# Patient Record
Sex: Female | Born: 1975 | Race: White | Hispanic: No | Marital: Married | Smoking: Former smoker
Health system: Southern US, Community
[De-identification: ages and names within clinical notes are randomized; demographics above are authoritative.]

## PROBLEM LIST (undated history)

## (undated) DIAGNOSIS — J309 Allergic rhinitis, unspecified: Secondary | ICD-10-CM

## (undated) DIAGNOSIS — K219 Gastro-esophageal reflux disease without esophagitis: Secondary | ICD-10-CM

## (undated) DIAGNOSIS — E785 Hyperlipidemia, unspecified: Secondary | ICD-10-CM

## (undated) DIAGNOSIS — F419 Anxiety disorder, unspecified: Secondary | ICD-10-CM

## (undated) DIAGNOSIS — K589 Irritable bowel syndrome without diarrhea: Secondary | ICD-10-CM

## (undated) DIAGNOSIS — Z856 Personal history of leukemia: Secondary | ICD-10-CM

## (undated) DIAGNOSIS — M519 Unspecified thoracic, thoracolumbar and lumbosacral intervertebral disc disorder: Secondary | ICD-10-CM

## (undated) DIAGNOSIS — I10 Essential (primary) hypertension: Secondary | ICD-10-CM

## (undated) HISTORY — PX: BILATERAL SALPINGECTOMY: SHX5743

## (undated) HISTORY — DX: Hyperlipidemia, unspecified: E78.5

## (undated) HISTORY — PX: CHOLECYSTECTOMY: SHX55

## (undated) HISTORY — DX: Personal history of leukemia: Z85.6

## (undated) HISTORY — DX: Unspecified thoracic, thoracolumbar and lumbosacral intervertebral disc disorder: M51.9

## (undated) HISTORY — PX: BACK SURGERY: SHX140

## (undated) HISTORY — DX: Anxiety disorder, unspecified: F41.9

## (undated) HISTORY — DX: Gastro-esophageal reflux disease without esophagitis: K21.9

## (undated) HISTORY — DX: Irritable bowel syndrome, unspecified: K58.9

## (undated) HISTORY — PX: DILATION AND CURETTAGE OF UTERUS: SHX78

## (undated) HISTORY — DX: Essential (primary) hypertension: I10

## (undated) HISTORY — DX: Allergic rhinitis, unspecified: J30.9

## (undated) HISTORY — PX: UMBILICAL HERNIA REPAIR: SHX196

---

## 2014-03-24 ENCOUNTER — Encounter: Payer: Self-pay | Admitting: Cardiology

## 2014-03-26 ENCOUNTER — Encounter: Payer: Self-pay | Admitting: Cardiology

## 2015-08-05 ENCOUNTER — Encounter: Payer: Self-pay | Admitting: Cardiology

## 2015-08-20 ENCOUNTER — Ambulatory Visit: Payer: Self-pay | Admitting: Cardiology

## 2015-09-16 ENCOUNTER — Encounter: Payer: Self-pay | Admitting: *Deleted

## 2015-09-16 ENCOUNTER — Other Ambulatory Visit: Payer: Self-pay | Admitting: *Deleted

## 2015-09-18 ENCOUNTER — Encounter: Payer: Self-pay | Admitting: *Deleted

## 2015-09-18 ENCOUNTER — Ambulatory Visit (INDEPENDENT_AMBULATORY_CARE_PROVIDER_SITE_OTHER): Payer: BLUE CROSS/BLUE SHIELD | Admitting: Cardiology

## 2015-09-18 ENCOUNTER — Encounter: Payer: Self-pay | Admitting: Cardiology

## 2015-09-18 VITALS — BP 131/84 | HR 71 | Ht 68.0 in | Wt 244.0 lb

## 2015-09-18 DIAGNOSIS — Z136 Encounter for screening for cardiovascular disorders: Secondary | ICD-10-CM | POA: Diagnosis not present

## 2015-09-18 DIAGNOSIS — E785 Hyperlipidemia, unspecified: Secondary | ICD-10-CM

## 2015-09-18 DIAGNOSIS — I10 Essential (primary) hypertension: Secondary | ICD-10-CM | POA: Diagnosis not present

## 2015-09-18 DIAGNOSIS — F17201 Nicotine dependence, unspecified, in remission: Secondary | ICD-10-CM

## 2015-09-18 DIAGNOSIS — R0789 Other chest pain: Secondary | ICD-10-CM

## 2015-09-18 NOTE — Patient Instructions (Signed)
Your physician recommends that you schedule a follow-up appointment TO BE DETERMINED AFTER TESTING  Your physician recommends that you continue on your current medications as directed. Please refer to the Current Medication list given to you today.  Your physician has requested that you have a stress echocardiogram. For further information please visit [email or web address]. Please follow instruction sheet as given.  Thank you for choosing [facility]!!    

## 2015-09-18 NOTE — Progress Notes (Signed)
Cardiology Office Note  Date: 09/18/2015   ID: Michelle Jefferson, DOB 05-18-1976, MRN [id number]  Referring provider: Dr. Ruthy Dick  Consulting Cardiologist: [name], MD   Chief Complaint  Patient presents with  . Chest Pain    History of Present Illness: Michelle Jefferson is a 40 y.o. female referred for cardiology consultation by Dr. [name] I reviewed the provided records and updated her chart. She presents for the evaluation of chest discomfort that she has noticed over the last 6 months. She reports a sharp, pin-like sensation in her chest that is sporadic, usually lasts no more than a minute, and is not clearly associated with exertion.  She has a history of hypertension that developed after pregnancy (she has been pregnant a total of 4 times and lost 2). She has history of preeclampsia with her most recent pregnancy in 2015. Echocardiography at that time showed normal LVEF, no cardiomyopathy. She states that she is not quite back to herself following this event.  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 Describes job stress, but seems tolerated fairly well.  I reviewed her ECG today which shows sinus rhythm with nonspecific T-wave changes.  We reviewed her current medications. She is on Lipitor, follow-up lipids are noted below. She also takes labetalol for her blood pressure. She has no history of diabetes mellitus. No definite family history of premature cardiovascular disease.  Past Medical History  Diagnosis Date  . History of leukemia     Childhood  . Hyperlipemia   . Essential hypertension   . Allergic rhinitis   . GERD (gastroesophageal reflux disease)   . Anxiety   . IBS (irritable bowel syndrome)   . Lumbar disc disease     Past Surgical History  Procedure Laterality Date  . Cholecystectomy    . Umbilical hernia repair    . Back surgery    . Dilation and curettage of uterus    . Cesarean section    . Bilateral salpingectomy       Current Outpatient Prescriptions  Medication Sig Dispense Refill  . atorvastatin (LIPITOR) 20 MG tablet Take 20 mg by mouth daily.  6  . FLUoxetine (PROZAC) 40 MG capsule Take 1 capsule by mouth daily.    . fluticasone (FLONASE) 50 MCG/ACT nasal spray Place into the nose as needed.    . labetalol (NORMODYNE) 100 MG tablet Take 1 tablet by mouth 2 (two) times daily.  6  . levofloxacin (LEVAQUIN) 500 MG tablet Take 500 mg by mouth daily.     Marland Kitchen levonorgestrel (MIRENA, 52 MG,) 20 MCG/24HR IUD 1 each by Intrauterine route once.    Marland Kitchen omeprazole (PRILOSEC OTC) 20 MG tablet Take 20 mg by mouth daily.      No current facility-administered medications for this visit.   Allergies:  Other; Sulfa antibiotics; and Cefaclor   Social History: The patient  reports that she quit smoking about 9 years ago. She does not have any smokeless tobacco history on file. She reports that she does not drink alcohol or use illicit drugs.   Family History: The patient's family history includes Diabetes in her father, maternal grandmother, and paternal aunt; Emphysema in her mother; Heart disease in her maternal grandfather and paternal grandfather; Hypertension in her father, mother, paternal grandfather, and paternal grandmother; Lung cancer in her father; Skin cancer in her mother; Thyroid disease in her maternal grandmother.   ROS:  Please see the history of present illness. Otherwise, complete review of  systems is positive for history of lower back pain.  All other systems are reviewed and negative.   Physical Exam: VS:  BP 131/84 mmHg  Pulse 71  Ht 5\' 8"  (1.727 m)  Wt 244 lb (110.678 kg)  BMI 37.11 kg/m2  SpO2 97%, BMI Body mass index is 37.11 kg/(m^2).  Wt Readings from Last 3 Encounters:  09/18/15 244 lb (110.678 kg)    General: Overweight woman, appears comfortable at rest. HEENT: Conjunctiva and lids normal, oropharynx clear. Neck: Supple, no elevated JVP or carotid bruits, no thyromegaly. Lungs:  Clear to auscultation, nonlabored breathing at rest. Cardiac: Regular rate and rhythm, no S3 or significant systolic murmur, no pericardial rub. Abdomen: Soft, nontender, bowel sounds present, no guarding or rebound. Extremities: No pitting edema, distal pulses 2+. Skin: Warm and dry. Musculoskeletal: No kyphosis. Neuropsychiatric: Alert and oriented x3, affect grossly appropriate.  ECG: I personally reviewed the prior tracing from 03/24/2014 which showed normal sinus rhythm.  Recent Labwork:  March 2017: Cholesterol 171, triglycerides 234, HDL 48, LDL 76  Other Studies Reviewed Today:  Echocardiogram 03/26/2014 [facility]([place]): Normal LV wall thickness with LVEF 60-65%, normal diastolic function, trace mitral regurgitation, trace tricuspid regurgitation, no pericardial effusion.  Assessment and Plan:  1. Atypical chest pain in an overweight 40 year old woman with history of hyperlipidemia and hypertension. ECG is nonspecific overall. She has a history of preeclampsia with her last pregnancy in 2015 but no cardiomyopathy, echocardiogram reviewed. We have discussed general risk factor modification and also basic ischemic evaluation. An exercise echocardiogram (with Definity contrast if needed) is being ordered.  2. Essential hypertension, blood pressure is adequately controlled today.  3. Hyperlipidemia, on statin therapy, recent LDL 76.  4. Tobacco abuse in remission, quit several years ago.  Current medicines were reviewed with the patient today.   Orders Placed This Encounter  Procedures  . EKG 12-Lead  . Echo stress    Disposition: Call with results.   Signed, Jonelle SidleSamuel G. Maddox Hlavaty, MD, [facility] 09/18/2015 2:00 PM    [facility] [address], [place], [place] [address] Phone: [phone](336) [phone]; Fax: [phone](336) [phone]

## 2015-09-18 NOTE — Addendum Note (Signed)
Addended by: Burman NievesASHWORTH, Aariona Momon T on: 09/18/2015 02:17 PM   Modules accepted: Orders

## 2015-09-28 ENCOUNTER — Other Ambulatory Visit (HOSPITAL_COMMUNITY): Payer: BLUE CROSS/BLUE SHIELD

## 2015-09-29 ENCOUNTER — Ambulatory Visit (HOSPITAL_COMMUNITY): Admission: RE | Admit: 2015-09-29 | Payer: BLUE CROSS/BLUE SHIELD | Source: Ambulatory Visit

## 2015-10-12 ENCOUNTER — Telehealth: Payer: Self-pay | Admitting: *Deleted

## 2015-10-12 ENCOUNTER — Encounter: Payer: Self-pay | Admitting: *Deleted

## 2015-10-12 ENCOUNTER — Telehealth: Payer: Self-pay | Admitting: Cardiology

## 2015-10-12 DIAGNOSIS — R072 Precordial pain: Secondary | ICD-10-CM

## 2015-10-12 NOTE — Telephone Encounter (Signed)
If she cannot ambulate on the treadmill, we could proceed with a Lexiscan Cardiolite instead.

## 2015-10-12 NOTE — Telephone Encounter (Signed)
Per husband, due to patient having a h/o of back surgery, patient doesn't feel like she can walk on a treadmill with incline. Patient is requesting to have stress test changed to a nuclear study. Please advise.

## 2015-10-12 NOTE — Telephone Encounter (Signed)
Husband informed and instructions for lexiscan read to husband over the phone. Husband would like to be informed directly with appointment information.

## 2015-10-12 NOTE — Telephone Encounter (Signed)
[facility] arrive at 8:45 Precordial pain Sched Instruct: Lexiscan Cardiolite on medication   Spoke with Mindi JunkerMarsha and gave appt time

## 2015-10-12 NOTE — Telephone Encounter (Signed)
No precert rqd

## 2015-10-16 ENCOUNTER — Encounter (HOSPITAL_COMMUNITY)
Admission: RE | Admit: 2015-10-16 | Discharge: 2015-10-16 | Disposition: A | Discharge status: Home / Self Care | Payer: BLUE CROSS/BLUE SHIELD | Source: Ambulatory Visit | Attending: Cardiology | Admitting: Cardiology

## 2015-10-16 ENCOUNTER — Inpatient Hospital Stay (HOSPITAL_COMMUNITY): Admission: RE | Admit: 2015-10-16 | Payer: BLUE CROSS/BLUE SHIELD | Source: Ambulatory Visit

## 2015-10-16 ENCOUNTER — Other Ambulatory Visit (HOSPITAL_COMMUNITY): Payer: BLUE CROSS/BLUE SHIELD

## 2015-10-16 DIAGNOSIS — I510 Cardiac septal defect, acquired: Secondary | ICD-10-CM | POA: Insufficient documentation

## 2015-10-16 DIAGNOSIS — R072 Precordial pain: Secondary | ICD-10-CM | POA: Insufficient documentation

## 2015-10-16 DIAGNOSIS — I5189 Other ill-defined heart diseases: Secondary | ICD-10-CM | POA: Diagnosis not present

## 2015-10-16 MED ORDER — TECHNETIUM TC 99M SESTAMIBI GENERIC - CARDIOLITE
10.0000 | Freq: Once | INTRAVENOUS | Status: AC | PRN
  Administered 2015-10-16: 10 via INTRAVENOUS

## 2015-10-16 MED ORDER — SODIUM CHLORIDE 0.9% FLUSH
INTRAVENOUS | Status: AC
  Administered 2015-10-16: 10 mL via INTRAVENOUS
  Filled 2015-10-16: qty 10

## 2015-10-16 MED ORDER — TECHNETIUM TC 99M SESTAMIBI - CARDIOLITE
30.0000 | Freq: Once | INTRAVENOUS | Status: AC | PRN
  Administered 2015-10-16: 30 via INTRAVENOUS

## 2015-10-16 MED ORDER — REGADENOSON 0.4 MG/5ML IV SOLN
INTRAVENOUS | Status: AC
  Administered 2015-10-16: 0.4 mg via INTRAVENOUS
  Filled 2015-10-16: qty 5

## 2015-10-19 LAB — NM MYOCAR MULTI W/SPECT W/WALL MOTION / EF
CHL CUP RESTING HR STRESS: 70 {beats}/min
CSEPPHR: 108 {beats}/min
LV dias vol: 114 mL (ref 46–106)
LVSYSVOL: 65 mL
RATE: 0.23
SDS: 8
SRS: 0
SSS: 8
TID: 1

## 2015-10-20 ENCOUNTER — Encounter: Payer: Self-pay | Admitting: Cardiology

## 2015-10-20 ENCOUNTER — Telehealth: Payer: Self-pay | Admitting: *Deleted

## 2015-10-20 ENCOUNTER — Encounter: Payer: Self-pay | Admitting: *Deleted

## 2015-10-20 ENCOUNTER — Ambulatory Visit (INDEPENDENT_AMBULATORY_CARE_PROVIDER_SITE_OTHER): Payer: BLUE CROSS/BLUE SHIELD | Admitting: Cardiology

## 2015-10-20 ENCOUNTER — Other Ambulatory Visit (HOSPITAL_COMMUNITY): Admission: RE | Admit: 2015-10-20 | Payer: BLUE CROSS/BLUE SHIELD | Source: Ambulatory Visit | Admitting: Cardiology

## 2015-10-20 ENCOUNTER — Other Ambulatory Visit (HOSPITAL_COMMUNITY)
Admission: RE | Admit: 2015-10-20 | Discharge: 2015-10-20 | Disposition: A | Discharge status: Home / Self Care | Payer: BLUE CROSS/BLUE SHIELD | Source: Ambulatory Visit | Attending: Cardiology | Admitting: Cardiology

## 2015-10-20 ENCOUNTER — Ambulatory Visit (HOSPITAL_COMMUNITY)
Admission: RE | Admit: 2015-10-20 | Discharge: 2015-10-20 | Disposition: A | Discharge status: Home / Self Care | Payer: BLUE CROSS/BLUE SHIELD | Source: Ambulatory Visit | Attending: Cardiology | Admitting: Cardiology

## 2015-10-20 VITALS — BP 134/88 | HR 95 | Ht 68.0 in | Wt 246.0 lb

## 2015-10-20 DIAGNOSIS — I2089 Other forms of angina pectoris: Secondary | ICD-10-CM

## 2015-10-20 DIAGNOSIS — R931 Abnormal findings on diagnostic imaging of heart and coronary circulation: Secondary | ICD-10-CM | POA: Insufficient documentation

## 2015-10-20 DIAGNOSIS — I208 Other forms of angina pectoris: Secondary | ICD-10-CM | POA: Diagnosis not present

## 2015-10-20 DIAGNOSIS — E785 Hyperlipidemia, unspecified: Secondary | ICD-10-CM | POA: Insufficient documentation

## 2015-10-20 DIAGNOSIS — R9439 Abnormal result of other cardiovascular function study: Secondary | ICD-10-CM

## 2015-10-20 DIAGNOSIS — Z01810 Encounter for preprocedural cardiovascular examination: Secondary | ICD-10-CM | POA: Diagnosis not present

## 2015-10-20 DIAGNOSIS — Z01812 Encounter for preprocedural laboratory examination: Secondary | ICD-10-CM | POA: Diagnosis present

## 2015-10-20 DIAGNOSIS — I10 Essential (primary) hypertension: Secondary | ICD-10-CM | POA: Diagnosis not present

## 2015-10-20 DIAGNOSIS — I209 Angina pectoris, unspecified: Secondary | ICD-10-CM | POA: Insufficient documentation

## 2015-10-20 LAB — CBC WITH DIFFERENTIAL/PLATELET
BASOS ABS: 0 10*3/uL (ref 0.0–0.1)
Basophils Relative: 0 %
EOS ABS: 0.2 10*3/uL (ref 0.0–0.7)
EOS PCT: 2 %
HCT: 38.4 % (ref 36.0–46.0)
HEMOGLOBIN: 12.4 g/dL (ref 12.0–15.0)
LYMPHS PCT: 32 %
Lymphs Abs: 3.2 10*3/uL (ref 0.7–4.0)
MCH: 28.5 pg (ref 26.0–34.0)
MCHC: 32.3 g/dL (ref 30.0–36.0)
MCV: 88.3 fL (ref 78.0–100.0)
Monocytes Absolute: 0.7 10*3/uL (ref 0.1–1.0)
Monocytes Relative: 7 %
NEUTROS PCT: 59 %
Neutro Abs: 5.8 10*3/uL (ref 1.7–7.7)
PLATELETS: 380 10*3/uL (ref 150–400)
RBC: 4.35 MIL/uL (ref 3.87–5.11)
RDW: 14.1 % (ref 11.5–15.5)
WBC: 9.9 10*3/uL (ref 4.0–10.5)

## 2015-10-20 LAB — BASIC METABOLIC PANEL
Anion gap: 8 (ref 5–15)
BUN: 11 mg/dL (ref 6–20)
CO2: 26 mmol/L (ref 22–32)
Calcium: 9.3 mg/dL (ref 8.9–10.3)
Chloride: 104 mmol/L (ref 101–111)
Creatinine, Ser: 0.78 mg/dL (ref 0.44–1.00)
Glucose, Bld: 102 mg/dL — ABNORMAL HIGH (ref 65–99)
POTASSIUM: 3.5 mmol/L (ref 3.5–5.1)
SODIUM: 138 mmol/L (ref 135–145)

## 2015-10-20 LAB — PROTIME-INR
INR: 0.98 (ref 0.00–1.49)
PROTHROMBIN TIME: 13.2 s (ref 11.6–15.2)

## 2015-10-20 MED ORDER — METOPROLOL TARTRATE 25 MG PO TABS: 12.5000 mg | ORAL_TABLET | Freq: Two times a day (BID) | ORAL | Status: AC

## 2015-10-20 MED ORDER — ASPIRIN EC 81 MG PO TBEC: 81.0000 mg | DELAYED_RELEASE_TABLET | Freq: Every day | ORAL | Status: AC

## 2015-10-20 NOTE — Telephone Encounter (Signed)
Patient informed and verbalized understanding of plan. 

## 2015-10-20 NOTE — Telephone Encounter (Signed)
-----   Message from Jonelle SidleSamuel G McDowell, MD sent at 10/19/2015  3:10 PM EDT ----- Reviewed report. Somewhat surprised by test abnormalities based on her description of symptoms. Test is abnormal suggesting possibility of LAD distribution ischemia with reduced LVEF. She should be scheduled for a follow-up visit soon to discuss cardiac catheterization for more definitive diagnosis. She can follow-up with me or an APP in the [place] office depending on availability.

## 2015-10-20 NOTE — Progress Notes (Signed)
Cardiology Office Note   Date:  10/20/2015   ID:  Michelle Jefferson, DOB 07-25-1975, MRN [id number]  PCP:  Majel Homer, MD  Cardiologist: Dr. Diona Browner      Chief Complaint  Patient presents with  . Chest Pain    occ episode, here for results      History of Present Illness: Michelle Jefferson is a 40 y.o. female who presents for results of stress test and discuss cardiac cath.    She has a history of hypertension that developed after pregnancy (she has been pregnant a total of 4 times and lost 2). She has history of preeclampsia with her most recent pregnancy in 2015. Echocardiography at that time showed normal LVEF, no cardiomyopathy. She stated that she is not quite back to herself following this event.  She was seen by Dr. Diona Browner for chest discomfort that have been occurring over last 6 months.    She had nuc study 10/16/15 with findings of prior ant. MI with significant peri-infarct ischemia, the ant. Wall defect is nearly completely reversible EF 30-44%.  She had normal LV function on Echo 2015.   She has brief episodes of sharp pain.  She is anxious today.  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   Discussed cath and possible results, stent, medical therapy or false positive.    Past Medical History  Diagnosis Date  . History of leukemia     Childhood  . Hyperlipemia   . Essential hypertension   . Allergic rhinitis   . GERD (gastroesophageal reflux disease)   . Anxiety   . IBS (irritable bowel syndrome)   . Lumbar disc disease     Past Surgical History  Procedure Laterality Date  . Cholecystectomy    . Umbilical hernia repair    . Back surgery    . Dilation and curettage of uterus    . Cesarean section    . Bilateral salpingectomy       Current Outpatient Prescriptions  Medication Sig Dispense Refill  . atorvastatin (LIPITOR) 20 MG tablet Take 20 mg by mouth daily.  6  . FLUoxetine (PROZAC) 40 MG capsule Take 1 capsule by mouth daily.      . fluticasone (FLONASE) 50 MCG/ACT nasal spray Place into the nose as needed.    . labetalol (NORMODYNE) 100 MG tablet Take 1 tablet by mouth 2 (two) times daily.  6  . levonorgestrel (MIRENA, 52 MG,) 20 MCG/24HR IUD 1 each by Intrauterine route once.    Marland Kitchen omeprazole (PRILOSEC OTC) 20 MG tablet Take 20 mg by mouth daily.     Marland Kitchen aspirin EC 81 MG tablet Take 1 tablet (81 mg total) by mouth daily. 90 tablet 3  . metoprolol tartrate (LOPRESSOR) 25 MG tablet Take 0.5 tablets (12.5 mg total) by mouth 2 (two) times daily. 90 tablet 3   No current facility-administered medications for this visit.    Allergies:   Other; Sulfa antibiotics; and Cefaclor    Social History:  The patient  reports that she quit smoking about 9 years ago. She does not have any smokeless tobacco history on file. She reports that she does not drink alcohol or use illicit drugs.   Family History:  The patient's family history includes Diabetes in her father, maternal grandmother, and paternal aunt; Emphysema in her mother; Heart disease in her maternal grandfather and paternal grandfather; Hypertension in her father, mother, paternal grandfather, and paternal grandmother; Lung cancer in her father; Skin  cancer in her mother; Thyroid disease in her maternal grandmother.    ROS:  General:no colds or fevers, no weight changes Skin:no rashes or ulcers HEENT:no blurred vision, no congestion CV:see HPI PUL:see HPI GI:no diarrhea constipation or melena, no indigestion GU:no hematuria, no dysuria MS:no joint pain, no claudication Neuro:no syncope, no lightheadedness Endo:no diabetes, no thyroid disease GYN on her period now.  Wt Readings from Last 3 Encounters:  10/20/15 246 lb (111.585 kg)  09/18/15 244 lb (110.678 kg)     PHYSICAL EXAM: VS:  BP 134/88 mmHg  Pulse 95  Ht 5\' 8"  (1.727 m)  Wt 246 lb (111.585 kg)  BMI 37.41 kg/m2  SpO2 98%  LMP 10/16/2015 , BMI Body mass index is 37.41 kg/(m^2). General:Pleasant  affect, NAD Skin:Warm and dry, brisk capillary refill HEENT:normocephalic, sclera clear, mucus membranes moist Neck:supple, no JVD, no bruits  Heart:S1S2 RRR without murmur, gallup, rub or click Lungs:clear without rales, rhonchi, or wheezes ZOX:WRUEAbd:soft, non tender, + BS, do not palpate liver spleen or masses Ext:no lower ext edema, 2+ pedal pulses, 2+ radial pulses Neuro:alert and oriented X 3, MAE, follows commands, + facial symmetry    EKG:  EKG is NOT ordered today.    Recent Labs: No results found for requested labs within last 365 days.    Lipid Panel No results found for: CHOL, TRIG, HDL, CHOLHDL, VLDL, LDLCALC, LDLDIRECT     Other studies Reviewed: Additional studies/ records that were reviewed today include: NUC stress test 10/16/15:   No T wave inversion was noted during stress.  There was no ST segment deviation noted during stress.  Findings consistent with prior anterior myocardial infarction with significant peri-infarct ischemia. The anterior wall defect is nearly completely reversible.  This is a high risk study based on large reversible anterior wall defect and decreased LVEF.  The left ventricular ejection fraction is moderately decreased (30-44%).   ASSESSMENT AND PLAN:  1.  Angina occ episode   2. abnormal lexiscan myoview. With ischemia in LAD distribution.  Discussed cardiac cath.    The patient understands that risks included but are not limited to stroke (1 in 1000), death (1 in 1000), kidney failure [usually temporary] (1 in 500), bleeding (1 in 200), allergic reaction [possibly serious] (1 in 200).    I started her on asa 81 mg daily and lopressor 12.5 BID.  Plan for cath on Thursday, she would like to have done- her daughter has play next week.   She is on statin.  3. Essential HTN  4. Hyperlipidemia, continue statin.   Current medicines are reviewed with the patient today.  The patient Has no concerns regarding medicines.  The  following changes have been made:  See above Labs/ tests ordered today include:see above  Disposition:   FU:  see above  Signed, Nada BoozerLaura Ingold, NP  10/20/2015 4:07 PM    [facility] [address], [place], [place]  [address]/ [address] [place], [place] Phone: [phone](336) [phone]; Fax: [phone]  [phone]

## 2015-10-20 NOTE — Patient Instructions (Signed)
Your physician has requested that you have a cardiac catheterization. Cardiac catheterization is used to diagnose and/or treat various heart conditions. Doctors may recommend this procedure for a number of different reasons. The most common reason is to evaluate chest pain. Chest pain can be a symptom of coronary artery disease (CAD), and cardiac catheterization can show whether plaque is narrowing or blocking your heart's arteries. This procedure is also used to evaluate the valves, as well as measure the blood flow and oxygen levels in different parts of your heart. For further information please visit [email or web address] Please follow instruction sheet, as given.   Your physician has recommended you make the following change in your medication:   Start Lopressor 12.5 mg Two Times Daily  Start Aspirin 81 mg Daily  Your physician recommends that you have lab work done today.   If you need a refill on your cardiac medications before your next appointment, please call your pharmacy.  Thank you for choosing [facility]!

## 2015-10-22 ENCOUNTER — Encounter (HOSPITAL_COMMUNITY): Payer: Self-pay | Admitting: Interventional Cardiology

## 2015-10-22 ENCOUNTER — Encounter (HOSPITAL_COMMUNITY)
Admission: RE | Disposition: A | Discharge status: Home / Self Care | Payer: Self-pay | Source: Ambulatory Visit | Attending: Interventional Cardiology

## 2015-10-22 ENCOUNTER — Ambulatory Visit (HOSPITAL_COMMUNITY)
Admission: RE | Admit: 2015-10-22 | Discharge: 2015-10-22 | Disposition: A | Discharge status: Home / Self Care | Payer: BLUE CROSS/BLUE SHIELD | Source: Ambulatory Visit | Attending: Interventional Cardiology | Admitting: Interventional Cardiology

## 2015-10-22 DIAGNOSIS — Z8249 Family history of ischemic heart disease and other diseases of the circulatory system: Secondary | ICD-10-CM | POA: Insufficient documentation

## 2015-10-22 DIAGNOSIS — M4646 Discitis, unspecified, lumbar region: Secondary | ICD-10-CM | POA: Insufficient documentation

## 2015-10-22 DIAGNOSIS — I10 Essential (primary) hypertension: Secondary | ICD-10-CM | POA: Insufficient documentation

## 2015-10-22 DIAGNOSIS — Z833 Family history of diabetes mellitus: Secondary | ICD-10-CM | POA: Insufficient documentation

## 2015-10-22 DIAGNOSIS — R931 Abnormal findings on diagnostic imaging of heart and coronary circulation: Secondary | ICD-10-CM | POA: Diagnosis not present

## 2015-10-22 DIAGNOSIS — E785 Hyperlipidemia, unspecified: Secondary | ICD-10-CM | POA: Insufficient documentation

## 2015-10-22 DIAGNOSIS — I209 Angina pectoris, unspecified: Secondary | ICD-10-CM | POA: Insufficient documentation

## 2015-10-22 DIAGNOSIS — F419 Anxiety disorder, unspecified: Secondary | ICD-10-CM | POA: Diagnosis not present

## 2015-10-22 DIAGNOSIS — K589 Irritable bowel syndrome without diarrhea: Secondary | ICD-10-CM | POA: Insufficient documentation

## 2015-10-22 DIAGNOSIS — Z87891 Personal history of nicotine dependence: Secondary | ICD-10-CM | POA: Diagnosis not present

## 2015-10-22 DIAGNOSIS — J309 Allergic rhinitis, unspecified: Secondary | ICD-10-CM | POA: Diagnosis not present

## 2015-10-22 DIAGNOSIS — R9439 Abnormal result of other cardiovascular function study: Secondary | ICD-10-CM | POA: Insufficient documentation

## 2015-10-22 DIAGNOSIS — K219 Gastro-esophageal reflux disease without esophagitis: Secondary | ICD-10-CM | POA: Insufficient documentation

## 2015-10-22 HISTORY — PX: CARDIAC CATHETERIZATION: SHX172

## 2015-10-22 LAB — PREGNANCY, URINE: Preg Test, Ur: NEGATIVE

## 2015-10-22 SURGERY — LEFT HEART CATH AND CORONARY ANGIOGRAPHY
Anesthesia: LOCAL

## 2015-10-22 MED ORDER — SODIUM CHLORIDE 0.9 % IV SOLN: 250.0000 mL | INTRAVENOUS | Status: DC | PRN

## 2015-10-22 MED ORDER — MIDAZOLAM HCL 2 MG/2ML IJ SOLN
INTRAMUSCULAR | Status: AC
  Filled 2015-10-22: qty 2

## 2015-10-22 MED ORDER — SODIUM CHLORIDE 0.9% FLUSH: 3.0000 mL | INTRAVENOUS | Status: DC | PRN

## 2015-10-22 MED ORDER — FENTANYL CITRATE (PF) 100 MCG/2ML IJ SOLN
INTRAMUSCULAR | Status: AC
  Filled 2015-10-22: qty 2

## 2015-10-22 MED ORDER — SODIUM CHLORIDE 0.9 % WEIGHT BASED INFUSION
3.0000 mL/kg/h | INTRAVENOUS | Status: DC
  Administered 2015-10-22: 3 mL/kg/h via INTRAVENOUS

## 2015-10-22 MED ORDER — SODIUM CHLORIDE 0.9 % WEIGHT BASED INFUSION: 1.0000 mL/kg/h | INTRAVENOUS | Status: DC

## 2015-10-22 MED ORDER — LIDOCAINE HCL (PF) 1 % IJ SOLN
INTRAMUSCULAR | Status: DC | PRN
  Administered 2015-10-22: 2 mL via INTRADERMAL

## 2015-10-22 MED ORDER — IOPAMIDOL (ISOVUE-370) INJECTION 76%
INTRAVENOUS | Status: AC
  Filled 2015-10-22: qty 100

## 2015-10-22 MED ORDER — SODIUM CHLORIDE 0.9% FLUSH: 3.0000 mL | Freq: Two times a day (BID) | INTRAVENOUS | Status: DC

## 2015-10-22 MED ORDER — HEPARIN (PORCINE) IN NACL 2-0.9 UNIT/ML-% IJ SOLN
INTRAMUSCULAR | Status: DC | PRN
Start: 2015-10-22 — End: 2015-10-22
  Administered 2015-10-22: 1000 mL

## 2015-10-22 MED ORDER — MIDAZOLAM HCL 2 MG/2ML IJ SOLN
INTRAMUSCULAR | Status: DC | PRN
  Administered 2015-10-22: 1 mg via INTRAVENOUS
  Administered 2015-10-22: 2 mg via INTRAVENOUS

## 2015-10-22 MED ORDER — HEPARIN SODIUM (PORCINE) 1000 UNIT/ML IJ SOLN
INTRAMUSCULAR | Status: DC | PRN
  Administered 2015-10-22: 5000 [IU] via INTRAVENOUS

## 2015-10-22 MED ORDER — ASPIRIN 81 MG PO CHEW: 81.0000 mg | CHEWABLE_TABLET | ORAL | Status: DC

## 2015-10-22 MED ORDER — FENTANYL CITRATE (PF) 100 MCG/2ML IJ SOLN
INTRAMUSCULAR | Status: DC | PRN
  Administered 2015-10-22 (×2): 25 ug via INTRAVENOUS

## 2015-10-22 MED ORDER — LIDOCAINE HCL (PF) 1 % IJ SOLN
INTRAMUSCULAR | Status: AC
  Filled 2015-10-22: qty 30

## 2015-10-22 MED ORDER — VERAPAMIL HCL 2.5 MG/ML IV SOLN
INTRAVENOUS | Status: AC
  Filled 2015-10-22: qty 2

## 2015-10-22 MED ORDER — VERAPAMIL HCL 2.5 MG/ML IV SOLN
INTRAVENOUS | Status: DC | PRN
  Administered 2015-10-22: 10 mL via INTRA_ARTERIAL

## 2015-10-22 MED ORDER — HEPARIN SODIUM (PORCINE) 1000 UNIT/ML IJ SOLN
INTRAMUSCULAR | Status: AC
  Filled 2015-10-22: qty 1

## 2015-10-22 MED ORDER — HEPARIN (PORCINE) IN NACL 2-0.9 UNIT/ML-% IJ SOLN
INTRAMUSCULAR | Status: AC
  Filled 2015-10-22: qty 1000

## 2015-10-22 SURGICAL SUPPLY — 11 items

## 2015-10-22 NOTE — H&P (View-Only) (Signed)
 Cardiology Office Note   Date:  10/20/2015   ID:  [name], DOB 11/16/1975, MRN [id number]  PCP:  BUCHANAN,LINDA, MD  Cardiologist: Dr. McDowell      Chief Complaint  Patient presents with  . Chest Pain    occ episode, here for results      History of Present Illness: Michelle Jefferson is a 39 y.o. female who presents for results of stress test and discuss cardiac cath.    She has a history of hypertension that developed after pregnancy (she has been pregnant a total of 4 times and lost 2). She has history of preeclampsia with her most recent pregnancy in 2015. Echocardiography at that time showed normal LVEF, no cardiomyopathy. She stated that she is not quite back to herself following this event.  She was seen by Dr. McDowell for chest discomfort that have been occurring over last 6 months.    She had nuc study 10/16/15 with findings of prior ant. MI with significant peri-infarct ischemia, the ant. Wall defect is nearly completely reversible EF 30-44%.  She had normal LV function on Echo 2015.   She has brief episodes of sharp pain.  She is anxious today.  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   Discussed cath and possible results, stent, medical therapy or false positive.    Past Medical History  Diagnosis Date  . History of leukemia     Childhood  . Hyperlipemia   . Essential hypertension   . Allergic rhinitis   . GERD (gastroesophageal reflux disease)   . Anxiety   . IBS (irritable bowel syndrome)   . Lumbar disc disease     Past Surgical History  Procedure Laterality Date  . Cholecystectomy    . Umbilical hernia repair    . Back surgery    . Dilation and curettage of uterus    . Cesarean section    . Bilateral salpingectomy       Current Outpatient Prescriptions  Medication Sig Dispense Refill  . atorvastatin (LIPITOR) 20 MG tablet Take 20 mg by mouth daily.  6  . FLUoxetine (PROZAC) 40 MG capsule Take 1 capsule by mouth daily.      . fluticasone (FLONASE) 50 MCG/ACT nasal spray Place into the nose as needed.    . labetalol (NORMODYNE) 100 MG tablet Take 1 tablet by mouth 2 (two) times daily.  6  . levonorgestrel (MIRENA, 52 MG,) 20 MCG/24HR IUD 1 each by Intrauterine route once.    . omeprazole (PRILOSEC OTC) 20 MG tablet Take 20 mg by mouth daily.     . aspirin EC 81 MG tablet Take 1 tablet (81 mg total) by mouth daily. 90 tablet 3  . metoprolol tartrate (LOPRESSOR) 25 MG tablet Take 0.5 tablets (12.5 mg total) by mouth 2 (two) times daily. 90 tablet 3   No current facility-administered medications for this visit.    Allergies:   Other; Sulfa antibiotics; and Cefaclor    Social History:  The patient  reports that she quit smoking about 9 years ago. She does not have any smokeless tobacco history on file. She reports that she does not drink alcohol or use illicit drugs.   Family History:  The patient's family history includes Diabetes in her father, maternal grandmother, and paternal aunt; Emphysema in her mother; Heart disease in her maternal grandfather and paternal grandfather; Hypertension in her father, mother, paternal grandfather, and paternal grandmother; Lung cancer in her father; Skin   cancer in her mother; Thyroid disease in her maternal grandmother.    ROS:  General:no colds or fevers, no weight changes Skin:no rashes or ulcers HEENT:no blurred vision, no congestion CV:see HPI PUL:see HPI GI:no diarrhea constipation or melena, no indigestion GU:no hematuria, no dysuria MS:no joint pain, no claudication Neuro:no syncope, no lightheadedness Endo:no diabetes, no thyroid disease GYN on her period now.  Wt Readings from Last 3 Encounters:  10/20/15 246 lb (111.585 kg)  09/18/15 244 lb (110.678 kg)     PHYSICAL EXAM: VS:  BP 134/88 mmHg  Pulse 95  Ht 5\' 8"  (1.727 m)  Wt 246 lb (111.585 kg)  BMI 37.41 kg/m2  SpO2 98%  LMP 10/16/2015 , BMI Body mass index is 37.41 kg/(m^2). General:Pleasant  affect, NAD Skin:Warm and dry, brisk capillary refill HEENT:normocephalic, sclera clear, mucus membranes moist Neck:supple, no JVD, no bruits  Heart:S1S2 RRR without murmur, gallup, rub or click Lungs:clear without rales, rhonchi, or wheezes ZOX:WRUEAbd:soft, non tender, + BS, do not palpate liver spleen or masses Ext:no lower ext edema, 2+ pedal pulses, 2+ radial pulses Neuro:alert and oriented X 3, MAE, follows commands, + facial symmetry    EKG:  EKG is NOT ordered today.    Recent Labs: No results found for requested labs within last 365 days.    Lipid Panel No results found for: CHOL, TRIG, HDL, CHOLHDL, VLDL, LDLCALC, LDLDIRECT     Other studies Reviewed: Additional studies/ records that were reviewed today include: NUC stress test 10/16/15:   No T wave inversion was noted during stress.  There was no ST segment deviation noted during stress.  Findings consistent with prior anterior myocardial infarction with significant peri-infarct ischemia. The anterior wall defect is nearly completely reversible.  This is a high risk study based on large reversible anterior wall defect and decreased LVEF.  The left ventricular ejection fraction is moderately decreased (30-44%).   ASSESSMENT AND PLAN:  1.  Angina occ episode   2. abnormal lexiscan myoview. With ischemia in LAD distribution.  Discussed cardiac cath.    The patient understands that risks included but are not limited to stroke (1 in 1000), death (1 in 1000), kidney failure [usually temporary] (1 in 500), bleeding (1 in 200), allergic reaction [possibly serious] (1 in 200).    I started her on asa 81 mg daily and lopressor 12.5 BID.  Plan for cath on Thursday, she would like to have done- her daughter has play next week.   She is on statin.  3. Essential HTN  4. Hyperlipidemia, continue statin.   Current medicines are reviewed with the patient today.  The patient Has no concerns regarding medicines.  The  following changes have been made:  See above Labs/ tests ordered today include:see above  Disposition:   FU:  see above  Signed, Nada BoozerLaura Ollen Rao, NP  10/20/2015 4:07 PM    [facility] [address], [place], [place]  [address]/ [address] [place], [place] Phone: [phone](336) [phone]; Fax: [phone]  [phone]

## 2015-10-22 NOTE — Interval H&P Note (Signed)
Cath Lab Visit (complete for each Cath Lab visit)  Clinical Evaluation Leading to the Procedure:   ACS: No.  Non-ACS:    Anginal Classification: CCS II  Anti-ischemic medical therapy: Minimal Therapy (1 class of medications)  Non-Invasive Test Results: Intermediate-risk stress test findings: cardiac mortality 1-3%/year  Prior CABG: No previous CABG      History and Physical Interval Note:  10/22/2015 7:42 AM  Michelle JakesMarsha H Jefferson  has presented today for surgery, with the diagnosis of abnormal stress test  The various methods of treatment have been discussed with the patient and family. After consideration of risks, benefits and other options for treatment, the patient has consented to  Procedure(s): Left Heart Cath and Coronary Angiography (N/A) as a surgical intervention .  The patient's history has been reviewed, patient examined, no change in status, stable for surgery.  I have reviewed the patient's chart and labs.  Questions were answered to the patient's satisfaction.     Lance MussJayadeep Keona Sheffler

## 2015-10-22 NOTE — Discharge Instructions (Signed)
Radial Site Care °Refer to this sheet in the next few weeks. These instructions provide you with information about caring for yourself after your procedure. Your health care provider may also give you more specific instructions. Your treatment has been planned according to current medical practices, but problems sometimes occur. Call your health care provider if you have any problems or questions after your procedure. °WHAT TO EXPECT AFTER THE PROCEDURE °After your procedure, it is typical to have the following: °· Bruising at the radial site that usually fades within 1-2 weeks. °· Blood collecting in the tissue (hematoma) that may be painful to the touch. It should usually decrease in size and tenderness within 1-2 weeks. °HOME CARE INSTRUCTIONS °· Take medicines only as directed by your health care provider. °· You may shower 24-48 hours after the procedure or as directed by your health care provider. Remove the bandage (dressing) and gently wash the site with plain soap and water. Pat the area dry with a clean towel. Do not rub the site, because this may cause bleeding. °· Do not take baths, swim, or use a hot tub until your health care provider approves. °· Check your insertion site every day for redness, swelling, or drainage. °· Do not apply powder or lotion to the site. °· Do not flex or bend the affected arm for 24 hours or as directed by your health care provider. °· Do not push or pull heavy objects with the affected arm for 24 hours or as directed by your health care provider. °· Do not lift over 10 lb (4.5 kg) for 5 days after your procedure or as directed by your health care provider. °· Ask your health care provider when it is okay to: °¨ Return to work or school. °¨ Resume usual physical activities or sports. °¨ Resume sexual activity. °· Do not drive home if you are discharged the same day as the procedure. Have someone else drive you. °· You may drive 24 hours after the procedure unless otherwise  instructed by your health care provider. °· Do not operate machinery or power tools for 24 hours after the procedure. °· If your procedure was done as an outpatient procedure, which means that you went home the same day as your procedure, a responsible adult should be with you for the first 24 hours after you arrive home. °· Keep all follow-up visits as directed by your health care provider. This is important. °SEEK MEDICAL CARE IF: °· You have a fever. °· You have chills. °· You have increased bleeding from the radial site. Hold pressure on the site. °SEEK IMMEDIATE MEDICAL CARE IF: °· You have unusual pain at the radial site. °· You have redness, warmth, or swelling at the radial site. °· You have drainage (other than a small amount of blood on the dressing) from the radial site. °· The radial site is bleeding, and the bleeding does not stop after 30 minutes of holding steady pressure on the site. °· Your arm or hand becomes pale, cool, tingly, or numb. °  °This information is not intended to replace advice given to you by your health care provider. Make sure you discuss any questions you have with your health care provider. °  °Document Released: 06/25/2010 Document Revised: 06/13/2014 Document Reviewed: 12/09/2013 °Elsevier Interactive Patient Education ©2016 [employer] ° °

## 2015-11-18 ENCOUNTER — Ambulatory Visit (INDEPENDENT_AMBULATORY_CARE_PROVIDER_SITE_OTHER): Payer: BLUE CROSS/BLUE SHIELD | Admitting: Cardiology

## 2015-11-18 ENCOUNTER — Encounter: Payer: Self-pay | Admitting: Cardiology

## 2015-11-18 VITALS — BP 118/88 | HR 70 | Ht 68.0 in | Wt 250.0 lb

## 2015-11-18 DIAGNOSIS — E785 Hyperlipidemia, unspecified: Secondary | ICD-10-CM

## 2015-11-18 DIAGNOSIS — R0789 Other chest pain: Secondary | ICD-10-CM | POA: Diagnosis not present

## 2015-11-18 NOTE — Patient Instructions (Signed)
Your physician recommends that you continue on your current medications as directed. Please refer to the Current Medication list given to you today.  Your physician recommends that you schedule a follow-up appointment in: as needed  

## 2015-11-18 NOTE — Progress Notes (Signed)
Cardiology Office Note  Date: 11/18/2015   ID: Michelle JakesMarsha H Jefferson, DOB 16-Apr-1976, MRN [id number]  PCP: Majel HomerBUCHANAN,LINDA, MD  Primary Cardiologist: Nona DellSamuel McDowell, MD   Chief Complaint  Patient presents with  . Follow-up cardiac catheterization    History of Present Illness: Michelle Jefferson is a 40 y.o. female seen by me in consultation back in April with atypical chest pain. She was referred for ischemic evaluation with history of hypertension and hyperlipidemia. Cardiolite study indicated potential LAD distribution ischemia with reduced LVEF in the range of 30-45%. Based on this a cardiac catheterization was arranged for definitive evaluation. Fortunately, coronary arteries were found to be normal as was LVEF (Cardiolite study was false positive).  She presents today for a routine visit, doing very well. No progressive chest pain symptoms. I went over the results of her cardiac catheterization which were very reassuring. At this point would adopt basic strategy of risk factor modification. She follows with Dr. Wynona NeatBuchanan.  Past Medical History  Diagnosis Date  . History of leukemia     Childhood  . Hyperlipemia   . Essential hypertension   . Allergic rhinitis   . GERD (gastroesophageal reflux disease)   . Anxiety   . IBS (irritable bowel syndrome)   . Lumbar disc disease     Current Outpatient Prescriptions  Medication Sig Dispense Refill  . acetaminophen (TYLENOL) 500 MG tablet Take 500 mg by mouth daily as needed for mild pain.    Marland Kitchen. aspirin EC 81 MG tablet Take 1 tablet (81 mg total) by mouth daily. (Patient taking differently: Take 81 mg by mouth every evening. ) 90 tablet 3  . atorvastatin (LIPITOR) 20 MG tablet Take 20 mg by mouth daily at 6 PM.   6  . cetirizine (ZYRTEC) 10 MG tablet Take 10 mg by mouth at bedtime as needed for allergies.    Marland Kitchen. FLUoxetine (PROZAC) 40 MG capsule Take 40 mg by mouth daily.     . fluticasone (FLONASE) 50 MCG/ACT nasal spray Place 1 spray into  the nose as needed for allergies.     Marland Kitchen. labetalol (NORMODYNE) 100 MG tablet Take 100 mg by mouth 2 (two) times daily.   6  . levonorgestrel (MIRENA, 52 MG,) 20 MCG/24HR IUD 1 each by Intrauterine route once.    . metoprolol tartrate (LOPRESSOR) 25 MG tablet Take 0.5 tablets (12.5 mg total) by mouth 2 (two) times daily. 90 tablet 3  . omeprazole (PRILOSEC OTC) 20 MG tablet Take 20 mg by mouth daily.      No current facility-administered medications for this visit.   Allergies:  Other; Sulfa antibiotics; and Cefaclor   Social History: The patient  reports that she quit smoking about 9 years ago. She has never used smokeless tobacco. She reports that she does not drink alcohol or use illicit drugs.   ROS:  Please see the history of present illness. Otherwise, complete review of systems is positive for reflux.  All other systems are reviewed and negative.   Physical Exam: VS:  BP 118/88 mmHg  Pulse 70  Ht 5\' 8"  (1.727 m)  Wt 250 lb (113.399 kg)  BMI 38.02 kg/m2  SpO2 97%  LMP 10/22/2015, BMI Body mass index is 38.02 kg/(m^2).  Wt Readings from Last 3 Encounters:  11/18/15 250 lb (113.399 kg)  10/22/15 246 lb (111.585 kg)  10/20/15 246 lb (111.585 kg)    General: Overweight woman, appears comfortable at rest. HEENT: Conjunctiva and lids normal, oropharynx clear. Neck:  Supple, no elevated JVP or carotid bruits, no thyromegaly. Lungs: Clear to auscultation, nonlabored breathing at rest. Cardiac: Regular rate and rhythm, no S3 or significant systolic murmur, no pericardial rub. Abdomen: Soft, nontender, bowel sounds present, no guarding or rebound. Extremities: No pitting edema, distal pulses 2+.  ECG: I personally reviewed the tracing from 10/22/2015 which showed sinus rhythm with small R' in lead V1 and V2.  Recent Labwork: 10/20/2015: BUN 11; Creatinine, Ser 0.78; Hemoglobin 12.4; Platelets 380; Potassium 3.5; Sodium 138   Other Studies Reviewed Today:  Cardiac catheterization  10/22/2015:  The left ventricular systolic function is normal.  No significant CAD.  Echocardiogram 03/26/2014 [facility]): Normal LV wall thickness with LVEF 60-65%, normal diastolic function, trace mitral regurgitation, trace tricuspid regurgitation, no pericardial effusion.  Assessment and Plan:  1. History of atypical chest pain with significantly abnormal Cardiolite study, however normal coronary arteries by cardiac catheterization. False positive stress testing was discussed with her today. This is overall very reassuring information. No further cardiac testing is planned.  2. Hyperlipidemia, on Lipitor. Keep follow-up with Dr. Wynona Neat.  Current medicines were reviewed with the patient today.  Patient Instructions  Your physician recommends that you continue on your current medications as directed. Please refer to the Current Medication list given to you today. Your physician recommends that you schedule a follow-up appointment in: as needed.   Signed, Jonelle Sidle, MD, [facility] 11/18/2015 3:00 PM    [facility] [address] [place], [place], [place] [address] Phone: [phone]; Fax: [phone]

## 2017-02-23 IMAGING — NM NM MYOCAR MULTI W/SPECT W/WALL MOTION & EF
2 series · 12 of 12 positions shown · non-contrast
Comparison: none

[Series 1: rest · 8.28mm/px · 6 of 64 frames shown]
[frame 6/64]
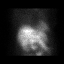
[frame 16/64]
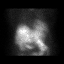
[frame 27/64]
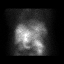
[frame 38/64]
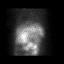
[frame 48/64]
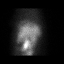
[frame 59/64]
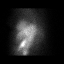

[Series 2: stress gated · 8.28mm/px · 6 of 64 frames shown]
[frame 6/64]
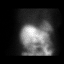
[frame 16/64]
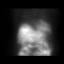
[frame 27/64]
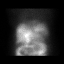
[frame 38/64]
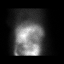
[frame 48/64]
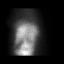
[frame 59/64]
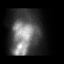

[12 of 12 positions shown; findings below may reference images not displayed]

Canned report from images found in remote index.

Refer to host system for actual result text.

## 2017-02-27 IMAGING — DX DG CHEST 2V
2 series · 2 of 2 positions shown · non-contrast
Comparison: No prior.

CLINICAL DATA: Preeclampsia.

EXAM:
CHEST  2 VIEW

[chest pa]
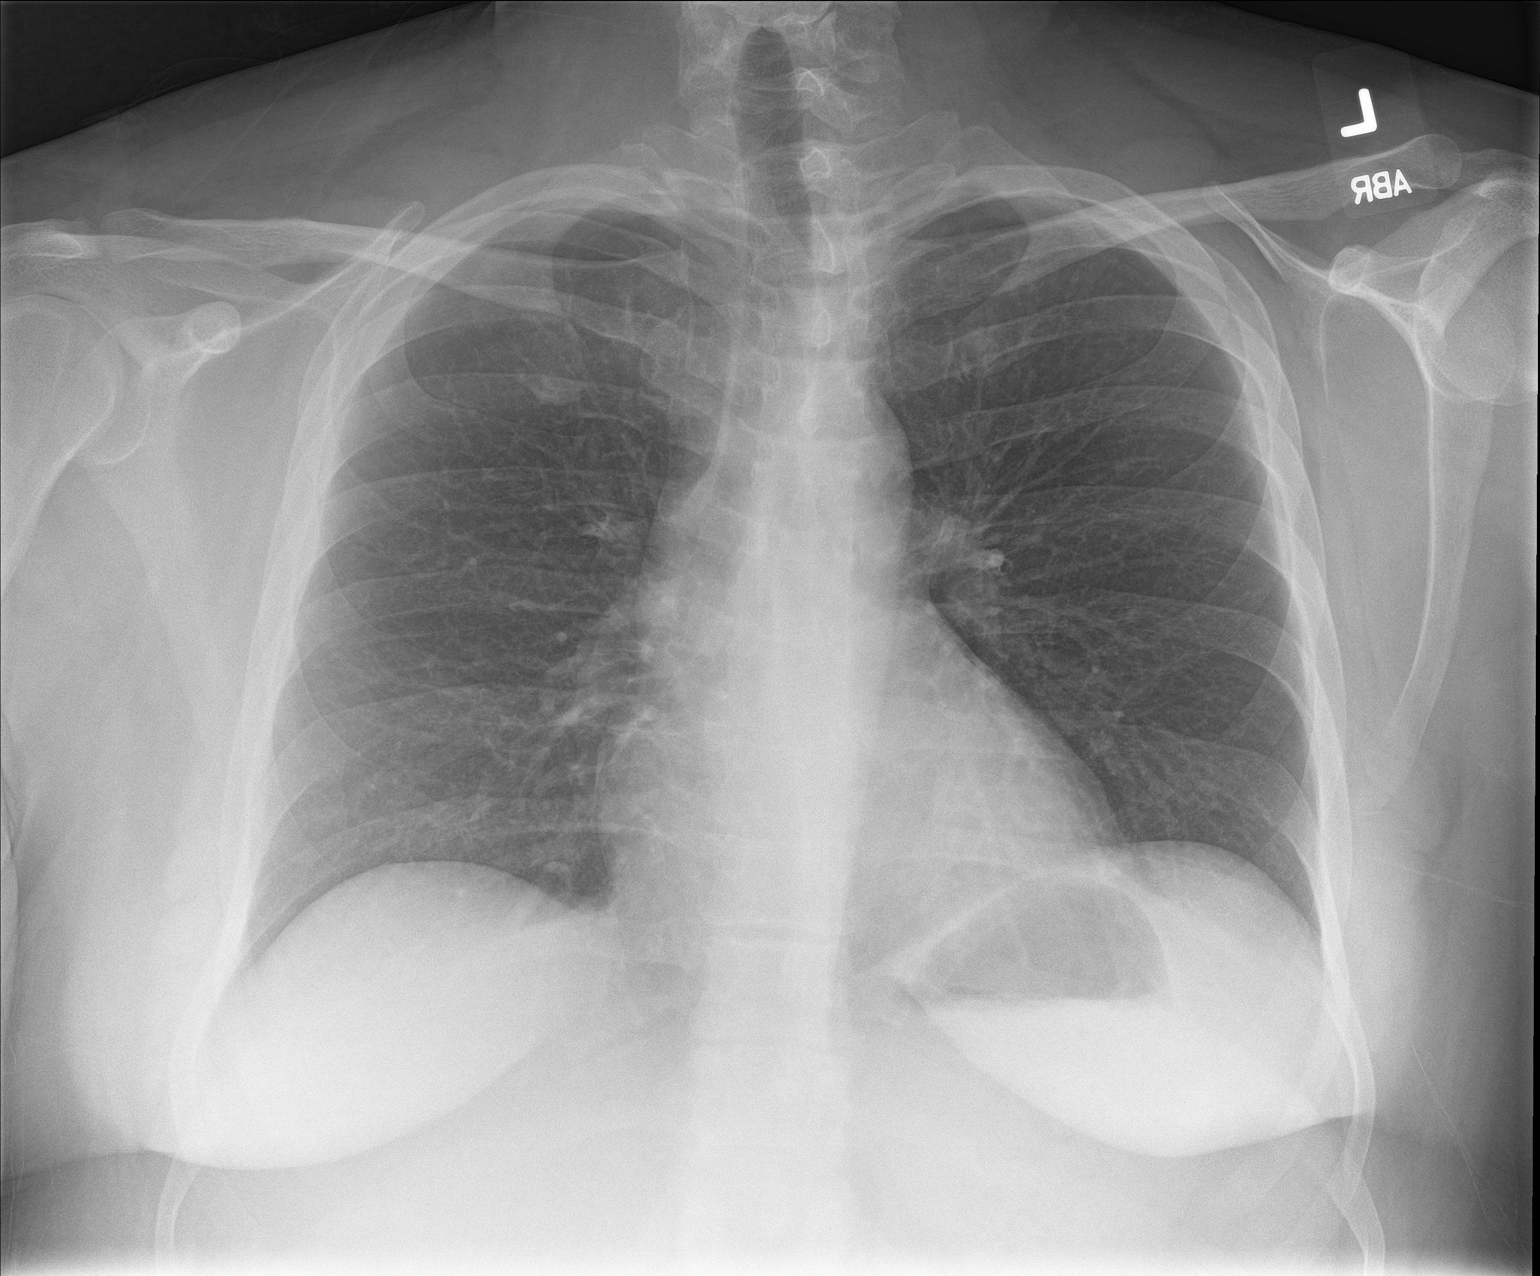

[chest lat]
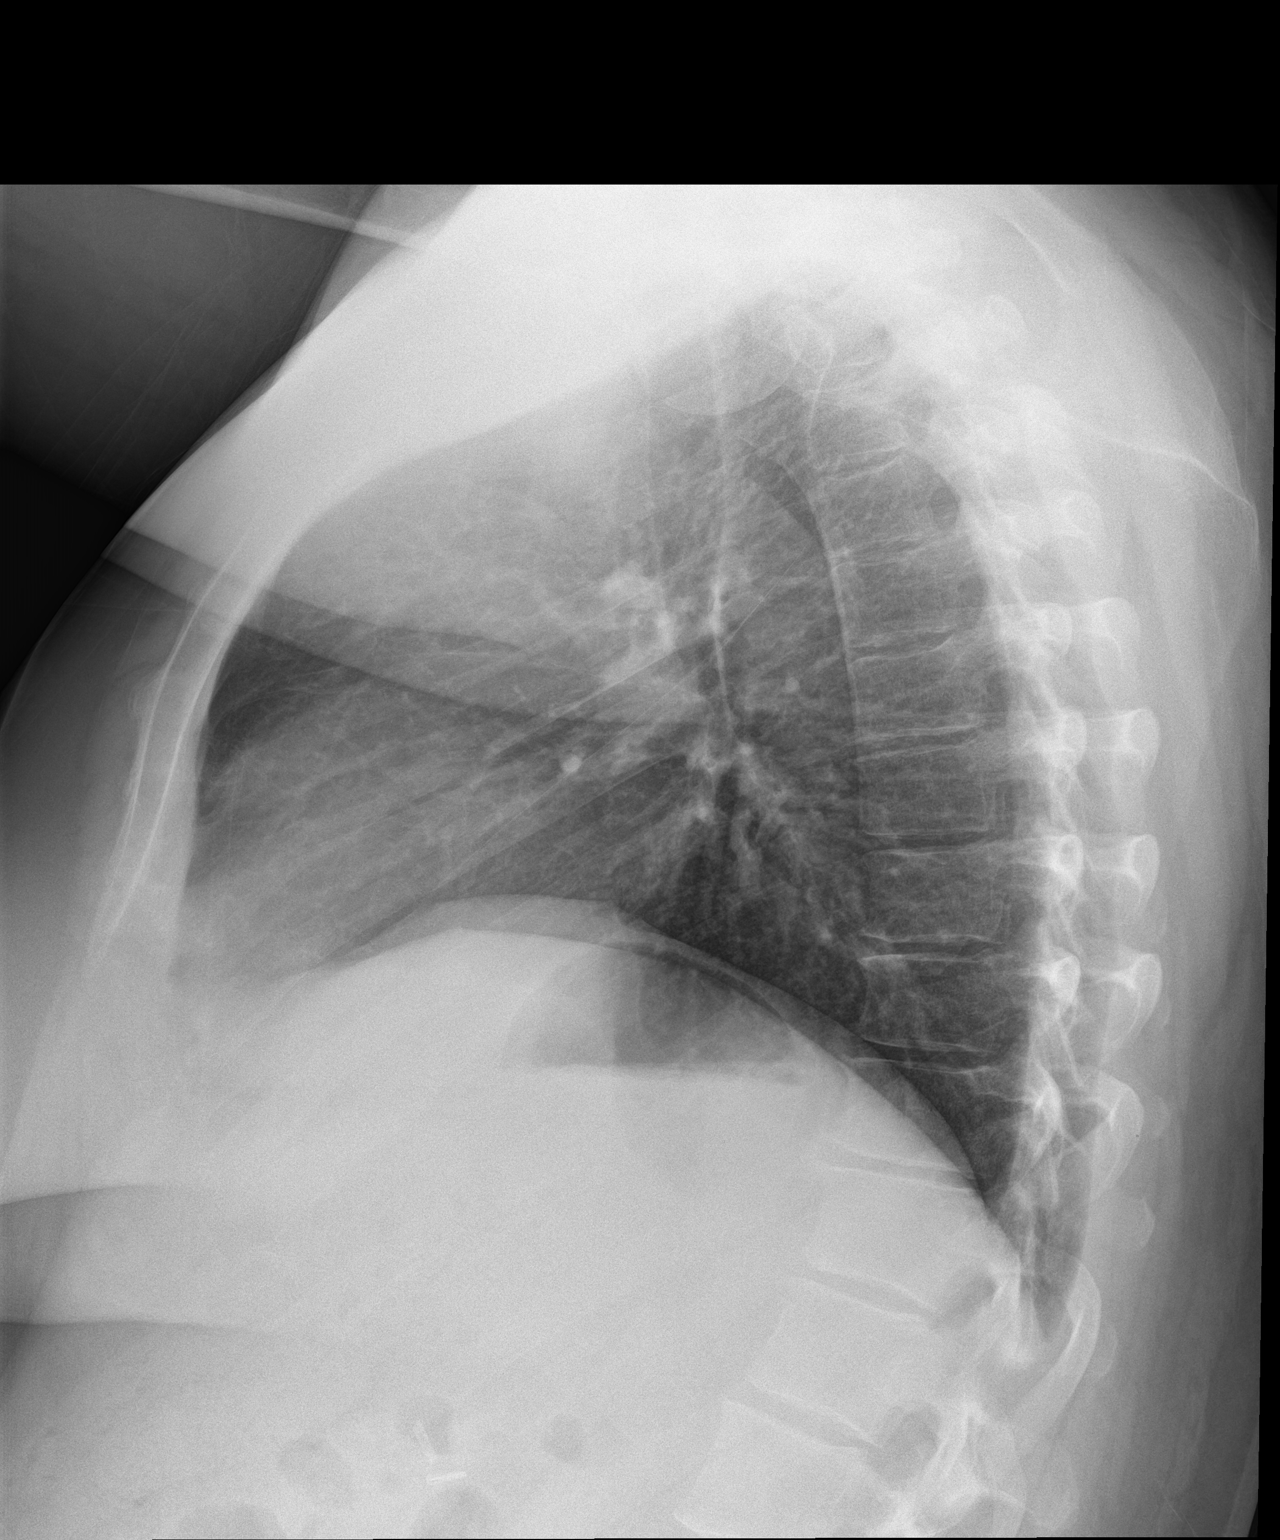

[2 of 2 positions shown; findings below may reference images not displayed]

FINDINGS: Mediastinum hilar structures normal. Lungs are clear. Heart size
normal. No pleural effusion or pneumothorax. No acute bony
abnormality.
IMPRESSION: No acute cardiopulmonary disease.
